# Patient Record
Sex: Male | Born: 2005
Health system: Southern US, Community
[De-identification: ages and names within clinical notes are randomized; demographics above are authoritative.]

## PROBLEM LIST (undated history)

## (undated) DIAGNOSIS — F959 Tic disorder, unspecified: Secondary | ICD-10-CM

## (undated) DIAGNOSIS — J302 Other seasonal allergic rhinitis: Secondary | ICD-10-CM

## (undated) DIAGNOSIS — J45909 Unspecified asthma, uncomplicated: Secondary | ICD-10-CM

## (undated) DIAGNOSIS — R4183 Borderline intellectual functioning: Secondary | ICD-10-CM

## (undated) HISTORY — DX: Borderline intellectual functioning: R41.83

## (undated) HISTORY — DX: Other seasonal allergic rhinitis: J30.2

## (undated) HISTORY — DX: Tic disorder, unspecified: F95.9

## (undated) HISTORY — PX: CYST REMOVAL PEDIATRIC: SHX6282

---
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
## 2025-02-05 ENCOUNTER — Ambulatory Visit (INDEPENDENT_AMBULATORY_CARE_PROVIDER_SITE_OTHER): Payer: Self-pay | Admitting: Neurology

## 2025-02-05 ENCOUNTER — Other Ambulatory Visit (INDEPENDENT_AMBULATORY_CARE_PROVIDER_SITE_OTHER): Payer: Self-pay
